# Patient Record
Sex: Male | Born: 1970 | Race: Black or African American | Hispanic: No | Marital: Married
Health system: Midwestern US, Community
[De-identification: ages and names within clinical notes are randomized; demographics above are authoritative.]

## PROBLEM LIST (undated history)

## (undated) DIAGNOSIS — M545 Low back pain, unspecified: Secondary | ICD-10-CM

## (undated) DIAGNOSIS — I10 Essential (primary) hypertension: Secondary | ICD-10-CM

## (undated) DIAGNOSIS — E785 Hyperlipidemia, unspecified: Secondary | ICD-10-CM

## (undated) DIAGNOSIS — E215 Disorder of parathyroid gland, unspecified: Secondary | ICD-10-CM

## (undated) DIAGNOSIS — E119 Type 2 diabetes mellitus without complications: Secondary | ICD-10-CM

## (undated) DIAGNOSIS — I499 Cardiac arrhythmia, unspecified: Secondary | ICD-10-CM

## (undated) DIAGNOSIS — G4730 Sleep apnea, unspecified: Secondary | ICD-10-CM

## (undated) DIAGNOSIS — G8929 Other chronic pain: Secondary | ICD-10-CM

## (undated) DIAGNOSIS — E213 Hyperparathyroidism, unspecified: Secondary | ICD-10-CM

## (undated) DIAGNOSIS — E210 Primary hyperparathyroidism: Secondary | ICD-10-CM

## (undated) HISTORY — DX: Cardiac arrhythmia, unspecified: I49.9

## (undated) HISTORY — DX: Low back pain, unspecified: M54.50

## (undated) HISTORY — DX: Other chronic pain: G89.29

## (undated) HISTORY — DX: Hyperlipidemia, unspecified: E78.5

## (undated) HISTORY — PX: PARATHYROIDECTOMY: SHX19

## (undated) HISTORY — PX: THYROID SURGERY: SHX805

## (undated) HISTORY — DX: Low back pain: M54.5

## (undated) HISTORY — DX: Disorder of parathyroid gland, unspecified: E21.5

## (undated) HISTORY — DX: Hyperparathyroidism, unspecified: E21.3

## (undated) HISTORY — DX: Sleep apnea, unspecified: G47.30

---

## 2016-12-09 ENCOUNTER — Encounter

## 2016-12-22 ENCOUNTER — Encounter

## 2016-12-30 ENCOUNTER — Inpatient Hospital Stay: Admit: 2016-12-30 | Payer: PRIVATE HEALTH INSURANCE | Attending: "Endocrinology | Primary: Family Medicine

## 2016-12-30 DIAGNOSIS — M899 Disorder of bone, unspecified: Secondary | ICD-10-CM

## 2017-01-09 ENCOUNTER — Inpatient Hospital Stay: Admit: 2017-01-09 | Payer: PRIVATE HEALTH INSURANCE | Attending: "Endocrinology | Primary: Family Medicine

## 2017-01-09 DIAGNOSIS — E210 Primary hyperparathyroidism: Secondary | ICD-10-CM

## 2017-01-09 MED ORDER — TECHNETIUM TC 99M SESTAMIBI - CARDIOLITE
Freq: Once | Status: AC
Start: 2017-01-09 — End: 2017-01-09
  Administered 2017-01-09: 16:00:00 via INTRAVENOUS

## 2017-02-15 ENCOUNTER — Encounter

## 2017-03-10 ENCOUNTER — Inpatient Hospital Stay: Admit: 2017-03-10 | Payer: PRIVATE HEALTH INSURANCE | Primary: Family Medicine

## 2017-03-10 DIAGNOSIS — Z01818 Encounter for other preprocedural examination: Secondary | ICD-10-CM

## 2017-03-10 LAB — METABOLIC PANEL, BASIC
Anion gap: 8 mmol/L (ref 5–15)
BUN/Creatinine ratio: 15 (ref 12–20)
BUN: 15 MG/DL (ref 6–20)
CO2: 28 mmol/L (ref 21–32)
Calcium: 10.6 MG/DL — ABNORMAL HIGH (ref 8.5–10.1)
Chloride: 106 mmol/L (ref 97–108)
Creatinine: 1 MG/DL (ref 0.70–1.30)
GFR est AA: >60 mL/min/{1.73_m2} (ref >60)
GFR est non-AA: >60 mL/min/{1.73_m2} (ref >60)
Glucose: 131 mg/dL — ABNORMAL HIGH (ref 65–100)
Potassium: 3.7 mmol/L (ref 3.5–5.1)
Sodium: 142 mmol/L (ref 136–145)

## 2017-03-10 NOTE — Other (Addendum)
PAT instructions reviewed with patient and family; and given the opportunity to ask questions.Patient given surgical site information FAQs handout and reviewed. Patient given CHG wipes and instruction sheet, instructions for use reviewed with patient. 1520 Notified Donna Bernardina Snyder, RN - ASU re: patient weight 413 lbs.

## 2017-03-11 LAB — EKG, 12 LEAD, INITIAL
Atrial Rate: 72 {beats}/min
Calculated P Axis: 0 degrees
Calculated R Axis: 46 degrees
Calculated T Axis: 38 degrees
Diagnosis: NORMAL
P-R Interval: 156 ms
Q-T Interval: 376 ms
QRS Duration: 98 ms
QTC Calculation (Bezet): 411 ms
Ventricular Rate: 72 {beats}/min

## 2017-03-11 LAB — EKG 12-LEAD
Atrial Rate: 72 {beats}/min
Diagnosis: NORMAL
P Axis: 0 degrees
P-R Interval: 156 ms
Q-T Interval: 376 ms
QRS Duration: 98 ms
QTc Calculation (Bazett): 411 ms
R Axis: 46 degrees
T Axis: 38 degrees
Ventricular Rate: 72 {beats}/min

## 2017-03-20 ENCOUNTER — Ambulatory Visit: Admit: 2017-03-20 | Payer: PRIVATE HEALTH INSURANCE | Primary: Family Medicine

## 2017-03-20 ENCOUNTER — Inpatient Hospital Stay: Payer: PRIVATE HEALTH INSURANCE

## 2017-03-20 LAB — PTH, 10 MIN. POST-EXCISION, INTRA-OP
Intra-op PTH: 35.8 pg/mL (ref 18.4–88.0)
Time drawn: 1150

## 2017-03-20 LAB — PTH, PRE-PREP, INTRA-OP
Intra-op PTH: 175.3 pg/mL — ABNORMAL HIGH (ref 18.4–88.0)
Time drawn: 1050

## 2017-03-20 LAB — PTH, PRE-EXCISION, INTRA-OP
Intra-op PTH: 221.2 pg/mL — ABNORMAL HIGH (ref 18.4–88.0)
Time drawn: 1203

## 2017-03-20 MED ORDER — LIDOCAINE (PF) 20 MG/ML (2 %) IJ SOLN
20 mg/mL (2 %) | INTRAMUSCULAR | Status: DC | PRN
Start: 2017-03-20 — End: 2017-03-20
  Administered 2017-03-20: 15:00:00 via INTRAVENOUS

## 2017-03-20 MED ORDER — MIDAZOLAM 1 MG/ML IJ SOLN
1 mg/mL | INTRAMUSCULAR | Status: AC
Start: 2017-03-20 — End: ?

## 2017-03-20 MED ORDER — LACTATED RINGERS IV
INTRAVENOUS | Status: DC | PRN
Start: 2017-03-20 — End: 2017-03-20
  Administered 2017-03-20: 15:00:00 via INTRAVENOUS

## 2017-03-20 MED ORDER — LACTATED RINGERS IV
INTRAVENOUS | Status: DC
Start: 2017-03-20 — End: 2017-03-20
  Administered 2017-03-20: 17:00:00 via INTRAVENOUS

## 2017-03-20 MED ORDER — DEXAMETHASONE SODIUM PHOSPHATE 4 MG/ML IJ SOLN
4 mg/mL | INTRAMUSCULAR | Status: DC | PRN
Start: 2017-03-20 — End: 2017-03-20
  Administered 2017-03-20: 15:00:00 via INTRAVENOUS

## 2017-03-20 MED ORDER — SODIUM CHLORIDE 0.9 % IJ SYRG
INTRAMUSCULAR | Status: DC | PRN
Start: 2017-03-20 — End: 2017-03-21

## 2017-03-20 MED ORDER — DEXTROSE 5%-LACTATED RINGERS IV
INTRAVENOUS | Status: DC
Start: 2017-03-20 — End: 2017-03-20

## 2017-03-20 MED ORDER — MORPHINE 2 MG/ML INJECTION
2 mg/mL | INTRAMUSCULAR | Status: DC | PRN
Start: 2017-03-20 — End: 2017-03-21

## 2017-03-20 MED ORDER — DEXTROSE 5%-LACTATED RINGERS IV
INTRAVENOUS | Status: DC
Start: 2017-03-20 — End: 2017-03-21
  Administered 2017-03-20 – 2017-03-21 (×3): via INTRAVENOUS

## 2017-03-20 MED ORDER — PROPOFOL 10 MG/ML IV EMUL
10 mg/mL | INTRAVENOUS | Status: DC | PRN
Start: 2017-03-20 — End: 2017-03-20
  Administered 2017-03-20 (×2): via INTRAVENOUS

## 2017-03-20 MED ORDER — ONDANSETRON (PF) 4 MG/2 ML INJECTION
4 mg/2 mL | INTRAMUSCULAR | Status: DC | PRN
Start: 2017-03-20 — End: 2017-03-20

## 2017-03-20 MED ORDER — CHLORTHALIDONE 25 MG TAB
25 mg | Freq: Every day | ORAL | Status: DC
Start: 2017-03-20 — End: 2017-03-21
  Administered 2017-03-21: 13:00:00 via ORAL

## 2017-03-20 MED ORDER — SODIUM CHLORIDE 0.9 % IJ SYRG
Freq: Three times a day (TID) | INTRAMUSCULAR | Status: DC
Start: 2017-03-20 — End: 2017-03-21
  Administered 2017-03-20 – 2017-03-21 (×3): via INTRAVENOUS

## 2017-03-20 MED ORDER — SODIUM CHLORIDE 0.9 % IJ SYRG
Freq: Three times a day (TID) | INTRAMUSCULAR | Status: DC
Start: 2017-03-20 — End: 2017-03-21
  Administered 2017-03-20 – 2017-03-21 (×3): via INTRAVENOUS

## 2017-03-20 MED ORDER — BACITRACIN 500 UNIT/G OINTMENT
500 unit/gram | CUTANEOUS | Status: AC
Start: 2017-03-20 — End: ?

## 2017-03-20 MED ORDER — ROCURONIUM 10 MG/ML IV
10 mg/mL | INTRAVENOUS | Status: DC | PRN
Start: 2017-03-20 — End: 2017-03-20
  Administered 2017-03-20: 15:00:00 via INTRAVENOUS

## 2017-03-20 MED ORDER — MIDAZOLAM 1 MG/ML IJ SOLN
1 mg/mL | INTRAMUSCULAR | Status: DC | PRN
Start: 2017-03-20 — End: 2017-03-20

## 2017-03-20 MED ORDER — FENTANYL CITRATE (PF) 50 MCG/ML IJ SOLN
50 mcg/mL | INTRAMUSCULAR | Status: AC
Start: 2017-03-20 — End: ?

## 2017-03-20 MED ORDER — FENTANYL CITRATE (PF) 50 MCG/ML IJ SOLN
50 mcg/mL | INTRAMUSCULAR | Status: DC | PRN
Start: 2017-03-20 — End: 2017-03-20
  Administered 2017-03-20 (×2): via INTRAVENOUS

## 2017-03-20 MED ORDER — SODIUM CHLORIDE 0.9 % IJ SYRG
INTRAMUSCULAR | Status: DC | PRN
Start: 2017-03-20 — End: 2017-03-20

## 2017-03-20 MED ORDER — OXYCODONE 5 MG TAB
5 mg | ORAL | Status: DC | PRN
Start: 2017-03-20 — End: 2017-03-20

## 2017-03-20 MED ORDER — DIPHENHYDRAMINE HCL 50 MG/ML IJ SOLN
50 mg/mL | INTRAMUSCULAR | Status: DC | PRN
Start: 2017-03-20 — End: 2017-03-20

## 2017-03-20 MED ORDER — CHOLECALCIFEROL (VITAMIN D3) 1,000 UNIT (25 MCG) TAB
Freq: Every day | ORAL | Status: DC
Start: 2017-03-20 — End: 2017-03-21
  Administered 2017-03-21: 13:00:00 via ORAL

## 2017-03-20 MED ORDER — SODIUM CHLORIDE 0.9 % IJ SYRG
Freq: Three times a day (TID) | INTRAMUSCULAR | Status: DC
Start: 2017-03-20 — End: 2017-03-20

## 2017-03-20 MED ORDER — MORPHINE 2 MG/ML INJECTION
2 mg/mL | INTRAMUSCULAR | Status: DC | PRN
Start: 2017-03-20 — End: 2017-03-20

## 2017-03-20 MED ORDER — LIDOCAINE-EPINEPHRINE 1 %-1:100,000 IJ SOLN
1 %-:00,000 | INTRAMUSCULAR | Status: AC
Start: 2017-03-20 — End: ?

## 2017-03-20 MED ORDER — LIDOCAINE (PF) 10 MG/ML (1 %) IJ SOLN
10 mg/mL (1 %) | INTRAMUSCULAR | Status: DC | PRN
Start: 2017-03-20 — End: 2017-03-20

## 2017-03-20 MED ORDER — ATORVASTATIN 20 MG TAB
20 mg | Freq: Every evening | ORAL | Status: DC
Start: 2017-03-20 — End: 2017-03-21
  Administered 2017-03-21: 01:00:00 via ORAL

## 2017-03-20 MED ORDER — MIDAZOLAM 1 MG/ML IJ SOLN
1 mg/mL | INTRAMUSCULAR | Status: DC | PRN
Start: 2017-03-20 — End: 2017-03-20
  Administered 2017-03-20: 15:00:00 via INTRAVENOUS

## 2017-03-20 MED ORDER — OXYCODONE-ACETAMINOPHEN 5 MG-325 MG TAB
5-325 mg | ORAL | Status: DC | PRN
Start: 2017-03-20 — End: 2017-03-20

## 2017-03-20 MED ORDER — ACETAMINOPHEN 500 MG TAB
500 mg | Freq: Four times a day (QID) | ORAL | Status: DC | PRN
Start: 2017-03-20 — End: 2017-03-21
  Administered 2017-03-20 – 2017-03-21 (×3): via ORAL

## 2017-03-20 MED ORDER — ONDANSETRON (PF) 4 MG/2 ML INJECTION
4 mg/2 mL | INTRAMUSCULAR | Status: DC | PRN
Start: 2017-03-20 — End: 2017-03-20
  Administered 2017-03-20: 15:00:00 via INTRAVENOUS

## 2017-03-20 MED ORDER — LACTATED RINGERS IV
INTRAVENOUS | Status: DC
Start: 2017-03-20 — End: 2017-03-21
  Administered 2017-03-20 (×3): via INTRAVENOUS

## 2017-03-20 MED ORDER — HYDROCODONE-ACETAMINOPHEN 5 MG-325 MG TAB
5-325 mg | ORAL_TABLET | Freq: Four times a day (QID) | ORAL | 0 refills | Status: AC | PRN
Start: 2017-03-20 — End: ?

## 2017-03-20 MED ORDER — LIDOCAINE-EPINEPHRINE 1 %-1:100,000 IJ SOLN
1 %-:00,000 | INTRAMUSCULAR | Status: DC | PRN
Start: 2017-03-20 — End: 2017-03-20
  Administered 2017-03-20: 15:00:00 via SUBCUTANEOUS

## 2017-03-20 MED ORDER — FENTANYL CITRATE (PF) 50 MCG/ML IJ SOLN
50 mcg/mL | INTRAMUSCULAR | Status: DC | PRN
Start: 2017-03-20 — End: 2017-03-20
  Administered 2017-03-20 (×4): via INTRAVENOUS

## 2017-03-20 MED ORDER — LACTATED RINGERS IV
INTRAVENOUS | Status: DC
Start: 2017-03-20 — End: 2017-03-20

## 2017-03-20 MED ORDER — CEFAZOLIN 3G IN 100 ML 0.9% NS PREMIX
3 gram/100ml | Freq: Once | INTRAVENOUS | Status: AC
Start: 2017-03-20 — End: 2017-03-20
  Administered 2017-03-20: 15:00:00 via INTRAVENOUS

## 2017-03-20 MED ORDER — FENTANYL CITRATE (PF) 50 MCG/ML IJ SOLN
50 mcg/mL | INTRAMUSCULAR | Status: DC | PRN
Start: 2017-03-20 — End: 2017-03-20

## 2017-03-20 MED ORDER — TECHNETIUM TC 99M SESTAMIBI - CARDIOLITE
Freq: Once | Status: AC
Start: 2017-03-20 — End: 2017-03-20
  Administered 2017-03-20: 13:00:00 via INTRAVENOUS

## 2017-03-20 MED ORDER — OXYCODONE-ACETAMINOPHEN 5 MG-325 MG TAB
5-325 mg | ORAL | Status: DC | PRN
Start: 2017-03-20 — End: 2017-03-21

## 2017-03-20 MED ORDER — MORPHINE 10 MG/ML INJ SOLUTION
10 mg/ml | INTRAMUSCULAR | Status: DC | PRN
Start: 2017-03-20 — End: 2017-03-20

## 2017-03-20 MED ORDER — SUCCINYLCHOLINE CHLORIDE 20 MG/ML INJECTION
20 mg/mL | INTRAMUSCULAR | Status: DC | PRN
Start: 2017-03-20 — End: 2017-03-20
  Administered 2017-03-20: 15:00:00 via INTRAVENOUS

## 2017-03-20 MED FILL — LACTATED RINGERS IV: INTRAVENOUS | Qty: 1000

## 2017-03-20 MED FILL — CEFAZOLIN 3G IN 100 ML 0.9% NS PREMIX: 3 gram/100ml | INTRAVENOUS | Qty: 100

## 2017-03-20 MED FILL — MIDAZOLAM 1 MG/ML IJ SOLN: 1 mg/mL | INTRAMUSCULAR | Qty: 2

## 2017-03-20 MED FILL — DEXTROSE 5%-LACTATED RINGERS IV: INTRAVENOUS | Qty: 1000

## 2017-03-20 MED FILL — LIDOCAINE-EPINEPHRINE 1 %-1:100,000 IJ SOLN: 1 %-:00,000 | INTRAMUSCULAR | Qty: 20

## 2017-03-20 MED FILL — ACETAMINOPHEN 500 MG TAB: 500 mg | ORAL | Qty: 2

## 2017-03-20 MED FILL — FENTANYL CITRATE (PF) 50 MCG/ML IJ SOLN: 50 mcg/mL | INTRAMUSCULAR | Qty: 2

## 2017-03-20 MED FILL — XYLOCAINE-MPF 10 MG/ML (1 %) INJECTION SOLUTION: 10 mg/mL (1 %) | INTRAMUSCULAR | Qty: 5

## 2017-03-20 MED FILL — BACITRACIN 500 UNIT/G OINTMENT: 500 unit/gram | CUTANEOUS | Qty: 14

## 2017-03-20 MED FILL — FENTANYL CITRATE (PF) 50 MCG/ML IJ SOLN: 50 mcg/mL | INTRAMUSCULAR | Qty: 5

## 2017-03-20 NOTE — H&P (Signed)
History and Physical    Subjective:     Clifford Summers is a 46 y.o. African American male who presents with primary hyperparathyroidism, without localization by imaging.         Past Medical History:   Diagnosis Date   ??? Arrhythmia     HX IRREG HR RELATED ELEVATED CALCIUM   ??? Chronic pain     LOWER BACK   ??? Hypertension    ??? Parathyroid disease (HCC)    ??? Sleep apnea     CPAP      Past Surgical History:   Procedure Laterality Date   ??? HX GASTRIC BYPASS  2009    LAP BAND     Family History   Problem Relation Age of Onset   ??? Cancer Mother      UTERINE   ??? Anesth Problems Neg Hx       Social History   Substance Use Topics   ??? Smoking status: Passive Smoke Exposure - Never Smoker   ??? Smokeless tobacco: Never Used   ??? Alcohol use No       Prior to Admission medications    Medication Sig Start Date End Date Taking? Authorizing Provider   HYDROcodone-acetaminophen (NORCO) 5-325 mg per tablet Take 2 Tabs by mouth every six (6) hours as needed for Pain for up to 30 doses. Max Daily Amount: 8 Tabs. Indications: Pain 03/20/17  Yes Roxana HiresAlan J Cid [employer], MD   atorvastatin (LIPITOR) 20 mg tablet Take 20 mg by mouth nightly.   Yes Historical Provider   cholecalciferol (VITAMIN D3) 1,000 unit cap Take 1,000 Units by mouth daily.   Yes Historical Provider   ibuprofen (MOTRIN) 200 mg tablet Take 600 mg by mouth every six (6) hours as needed for Pain.   Yes Historical Provider   multivitamin (ONE A DAY) tablet Take 1 Tab by mouth daily.   Yes Historical Provider   krill oil 500 mg cap Take 1 Cap by mouth two (2) days a week.   Yes Historical Provider   chlorthalidone (HYGROTEN) 25 mg tablet Take 25 mg by mouth daily.    Historical Provider     No Known Allergies     Review of Systems:  A comprehensive review of systems was negative except for that written in the History of Present Illness.     Objective:     Intake and Output:            Physical Exam:   Visit Vitals   ??? BP 150/88 (BP 1 Location: Right arm, BP Patient Position: Sitting)    ??? Pulse 83   ??? Temp 97.9 ??F (36.6 ??C)   ??? Resp 20   ??? Ht 6\' 2"  (1.88 m)   ??? Wt (!) 187.3 kg (413 lb)   ??? SpO2 99%   ??? BMI 53.03 kg/m2     General:  Alert, cooperative, no distress, appears stated age.  Obese   Head:  Normocephalic, without obvious abnormality, atraumatic.   Eyes:  Conjunctivae/corneas clear. PERRL, EOMs intact. Fundi benign   Ears:  Normal TMs and external ear canals both ears.   Nose: Nares normal. Septum midline. Mucosa normal. No drainage or sinus tenderness.   Throat: Lips, mucosa, and tongue normal. Teeth and gums normal.   Neck: Supple, symmetrical, trachea midline, no adenopathy, thyroid: no enlargement/tenderness/nodules, no carotid bruit and no JVD.   Back:   Symmetric, no curvature. ROM normal. No CVA tenderness.   Lungs:   Clear  to auscultation bilaterally.   Chest wall:  No tenderness or deformity.   Heart:  Regular rate and rhythm, S1, S2 normal, no murmur, click, rub or gallop.   Abdomen:   Soft, non-tender. Bowel sounds normal. No masses,  No organomegaly.   Genitalia:  Normal male without lesion, discharge or tenderness.   Rectal:  Normal tone, normal prostate, no masses or tenderness  Guaiac negative stool.   Extremities: Extremities normal, atraumatic, no cyanosis or edema.   Pulses: 2+ and symmetric all extremities.   Skin: Skin color, texture, turgor normal. No rashes or lesions   Lymph nodes: Cervical, supraclavicular, and axillary nodes normal.   Neurologic: CNII-XII intact. Normal strength, sensation and reflexes throughout.         Data Review:   No results found for this or any previous visit (from the past 24 hour(s)).      Assessment:     Active Problems:    Hyperparathyroidism, primary (HCC) (03/20/2017)        Plan:     Parathyroid exploration with intraoperative Sestimibi and PTH monitoring.     Signed By: Roxana Hires, MD     March 20, 2017

## 2017-03-20 NOTE — Progress Notes (Signed)
Dr. Laurell JosephsBurke [facility] . Already aware of them. Asked if need to do any more labs this pm. No orders needed for now per MD.

## 2017-03-20 NOTE — Progress Notes (Signed)
Bedside and Verbal shift change report given to Linda, RN (oncoming nurse) by Katherine Dabney, RN (offgoing nurse). Report included the following information SBAR, Kardex, Intake/Output, MAR, Accordion, Recent Results and Med Rec Status.

## 2017-03-20 NOTE — Anesthesia Pre-Procedure Evaluation (Addendum)
Anesthetic History   No history of anesthetic complications            Review of Systems / Medical History  Patient summary reviewed, nursing notes reviewed and pertinent labs reviewed    Pulmonary        Sleep apnea: CPAP           Neuro/Psych   Within defined limits           Cardiovascular    Hypertension        Dysrhythmias       Exercise tolerance: >4 METS     GI/Hepatic/Renal  Within defined limits              Endo/Other        Morbid obesity     Other Findings            Physical Exam    Airway  Mallampati: III  TM Distance: > 6 cm  Neck ROM: normal range of motion   Mouth opening: Normal     Cardiovascular  Regular rate and rhythm,  S1 and S2 normal,  no murmur, click, rub, or gallop             Dental  No notable dental hx       Pulmonary  Breath sounds clear to auscultation               Abdominal  GI exam deferred       Other Findings            Anesthetic Plan    ASA: 3  Anesthesia type: general          Induction: Intravenous  Anesthetic plan and risks discussed with: Patient

## 2017-03-20 NOTE — Anesthesia Post-Procedure Evaluation (Signed)
Post-Anesthesia Evaluation and Assessment    Patient: Clifford LopeKeith E Capozzoli MRN: [id number]  SSN: [id number]    Date of Birth: 06/18/1971  Age: 46 y.o.  Sex: male       Cardiovascular Function/Vital Signs  Visit Vitals   ??? BP (!) 150/91   ??? Pulse (!) 102   ??? Temp 36.4 ??C (97.6 ??F)   ??? Resp 12   ??? Ht 6\' 2"  (1.88 m)   ??? Wt (!) 187.3 kg (413 lb)   ??? SpO2 98%   ??? BMI 53.03 kg/m2       Patient is status post general anesthesia for Procedure(s):  PARATHYROIDECTOMY WITH RADIONUCLIDE GUIDANCE SUING TC-42M SESTAMIBI.    Nausea/Vomiting: None    Postoperative hydration reviewed and adequate.    Pain:  Pain Scale 1: Numeric (0 - 10) (03/20/17 1001)  Pain Intensity 1: 0 (03/20/17 1001)   Managed    Neurological Status:   Neuro (WDL): Within Defined Limits (03/20/17 1013)   At baseline    Mental Status and Level of Consciousness: Arousable    Pulmonary Status:   O2 Device: Nasal cannula (03/20/17 1214)   Adequate oxygenation and airway patent    Complications related to anesthesia: None    Post-anesthesia assessment completed. No concerns    Signed By: Chyrel MassonMichael D Sameena Artus, MD     March 20, 2017

## 2017-03-20 NOTE — Progress Notes (Signed)
Primary Nurse Dwana MelenaKatherine Dabney and Dory PeruGibson Burlee, RN performed a dual skin assessment on this patient Impairment noted- see wound doc flow sheet - TLS Drain   Braden score is 23

## 2017-03-20 NOTE — Progress Notes (Signed)
TRANSFER - OUT REPORT:    Verbal report given to Katie(name) on Clifford Summers  being transferred to 5 east(unit) for routine post - op       Report consisted of patient???s Situation, Background, Assessment and   Recommendations(SBAR).     Information from the following report(s) SBAR, OR Summary and MAR was reviewed with the receiving nurse.    Lines:   Peripheral IV 03/20/17 Left Wrist (Active)   Site Assessment Clean, dry, & intact 03/20/2017 10:27 AM   Phlebitis Assessment 0 03/20/2017 10:27 AM   Infiltration Assessment 0 03/20/2017 10:27 AM   Dressing Status Clean, dry, & intact 03/20/2017 10:27 AM   Dressing Type Transparent 03/20/2017 10:27 AM   Hub Color/Line Status Pink 03/20/2017 10:27 AM        Opportunity for questions and clarification was provided.      Patient transported with:   Registered Nurse

## 2017-03-20 NOTE — Op Note (Signed)
[facility]  ACUTE CARE OP NOTE    Name:Clifford Summers, Clifford Summers.  MR#: [id number]  DOB: 07/25/1971  ACCOUNT #: [id number]   DATE OF SERVICE: 03/20/2017    PREOPERATIVE DIAGNOSIS:  Primary hyperparathyroidism.    POSTOPERATIVE DIAGNOSIS:  Primary hyperparathyroidism.    PROCEDURE:  Bilateral parathyroid exploration.  Intraoperative sestamibi imaging.      SURGEON:  Cherri Yera, MD.    ASSISTANT:  none     ANESTHESIA:  General endotracheal anesthesia.    COMPLICATIONS:  No complications.    BLOOD LOSS:  20 mL.    SPECIMEN:  Right superior parathyroid mass.    IMPLANTS:  No implants.    INDICATIONS AND FINDINGS:  The patient was identified as having primary hyperparathyroidism with negative preoperative imaging.  Because of this challenging patient's anatomy, the patient is markedly obese, intraoperative sestamibi imaging was planned.  The procedure was much more challenging because of the patient's  challenging surgery and patient.      DETAILS OF PROCEDURE:  In the operating room, the patient was induced under general endotracheal tube anesthesia.  Following that, she was prepped and draped in a sterile fashion.  1% lidocaine with 1:100,000 epinephrine was used for local infiltration in the lower cervical rhytides.  The 5 cm incision was made in this ___ elevating superior and inferior subplatysmal skin flaps, retracted with silk sutures.  Strap muscles were divided in the midline and retracted.  The thyroid gland was identified and retracted with Allis clamp.  An extracapsular dissection was performed with bipolar cautery, McCabe dissector, and Kittner dissector, skeletonizing the superior and inferior pole starting on the right and then the left.  With the paratracheal anatomy exposed, the Neoprobe Geiger counter was used to identify an increased measurement in the right upper posterior zone.  The superior and inferior poles were explored bilaterally, identifying normal parathyroid glands at the  inferior pole.  Dissection continued more posterior on the right, identifying an abnormal parathyroid appearing mass in the precervical spine area, retroesophageal, medial to the carotid artery, consistent with a right superior parathyroid gland.  This was skeletonized using bipolar cautery to divide the vascular pedicle, handing this off for pathologic analysis, returning as a hypercellular gland.    Intraoperative parathyroid hormone measurement started at 170.  Ten minutes after dissecting the gland, the following measurement is approximately 30 consistent with removal of the culprit gland.    The wound was irrigated.  A Valsalva maneuver was used to check hemostasis.  A #10 TLS drain was placed on the left, secured with a nylon suture.  A 3-0 Vicryl was used to close the strap muscles in the midline, using the same to close the platysmal flap, after which a 5-0 Monocryl was used in an intracuticular fashion to close the skin, reinforced with Mastisol and Steri-Strip dressing.  The patient was then handed to anesthesia for awakening and transferred to the recovery room.      Deztinee Lohmeyer J. Bradley Bostelman, MD       AJB / DN  D: 03/20/2017 16:19     T: 03/20/2017 20:58  JOB #: [id number]

## 2017-03-20 NOTE — Progress Notes (Signed)
[facility] Dr. Asencion PartridgeJoseph Castro  On Monday March 27, 2017  @ 10:15 a.m. Added to AVS. Delma OfficerKristen Walker, CM Specialist

## 2017-03-20 NOTE — Op Note (Signed)
[facility]  ACUTE CARE OP NOTE    Name:Clifford Summers, Clifford Summers FormKEITH E.  MR#: [id number]  DOB: 1971/08/07  ACCOUNT #: [id number]   DATE OF SERVICE: 03/20/2017    PREOPERATIVE DIAGNOSIS:  Primary hyperparathyroidism.    POSTOPERATIVE DIAGNOSIS:  Primary hyperparathyroidism.    PROCEDURE:  Bilateral parathyroid exploration.  Intraoperative sestamibi imaging.      SURGEON:  Selinda EonAlan Sheleen Conchas, MD.    ASSISTANT:  none     ANESTHESIA:  General endotracheal anesthesia.    COMPLICATIONS:  No complications.    BLOOD LOSS:  20 mL.    SPECIMEN:  Right superior parathyroid mass.    IMPLANTS:  No implants.    INDICATIONS AND FINDINGS:  The patient was identified as having primary hyperparathyroidism with negative preoperative imaging.  Because of this challenging patient's anatomy, the patient is markedly obese, intraoperative sestamibi imaging was planned.  The procedure was much more challenging because of the patient's  challenging surgery and patient.      DETAILS OF PROCEDURE:  In the operating room, the patient was induced under general endotracheal tube anesthesia.  Following that, she was prepped and draped in a sterile fashion.  1% lidocaine with 1:100,000 epinephrine was used for local infiltration in the lower cervical rhytides.  The 5 cm incision was made in this ___ elevating superior and inferior subplatysmal skin flaps, retracted with silk sutures.  Strap muscles were divided in the midline and retracted.  The thyroid gland was identified and retracted with Allis clamp.  An extracapsular dissection was performed with bipolar cautery, McCabe dissector, and Kittner dissector, skeletonizing the superior and inferior pole starting on the right and then the left.  With the paratracheal anatomy exposed, the Neoprobe Geiger counter was used to identify an increased measurement in the right upper posterior zone.  The superior and inferior poles were explored bilaterally, identifying normal parathyroid glands at the  inferior pole.  Dissection continued more posterior on the right, identifying an abnormal parathyroid appearing mass in the precervical spine area, retroesophageal, medial to the carotid artery, consistent with a right superior parathyroid gland.  This was skeletonized using bipolar cautery to divide the vascular pedicle, handing this off for pathologic analysis, returning as a hypercellular gland.    Intraoperative parathyroid hormone measurement started at 170.  Ten minutes after dissecting the gland, the following measurement is approximately 30 consistent with removal of the culprit gland.    The wound was irrigated.  A Valsalva maneuver was used to check hemostasis.  A #10 TLS drain was placed on the left, secured with a nylon suture.  A 3-0 Vicryl was used to close the strap muscles in the midline, using the same to close the platysmal flap, after which a 5-0 Monocryl was used in an intracuticular fashion to close the skin, reinforced with Mastisol and Steri-Strip dressing.  The patient was then handed to anesthesia for awakening and transferred to the recovery room.      Roxana HiresALAN J. Cynithia Hakimi, MD       AJB / DN  D: 03/20/2017 16:19     T: 03/20/2017 20:58  JOB #: [id number]

## 2017-03-21 MED FILL — ONDANSETRON (PF) 4 MG/2 ML INJECTION: 4 mg/2 mL | INTRAMUSCULAR | Qty: 2

## 2017-03-21 MED FILL — NORMAL SALINE FLUSH 0.9 % INJECTION SYRINGE: INTRAMUSCULAR | Qty: 10

## 2017-03-21 MED FILL — CHOLECALCIFEROL (VITAMIN D3) 1,000 UNIT (25 MCG) TAB: ORAL | Qty: 2

## 2017-03-21 MED FILL — XYLOCAINE-MPF 20 MG/ML (2 %) INJECTION SOLUTION: 20 mg/mL (2 %) | INTRAMUSCULAR | Qty: 5

## 2017-03-21 MED FILL — ACETAMINOPHEN 500 MG TAB: 500 mg | ORAL | Qty: 2

## 2017-03-21 MED FILL — DEXAMETHASONE SODIUM PHOSPHATE 4 MG/ML IJ SOLN: 4 mg/mL | INTRAMUSCULAR | Qty: 2

## 2017-03-21 MED FILL — PROPOFOL 10 MG/ML IV EMUL: 10 mg/mL | INTRAVENOUS | Qty: 40

## 2017-03-21 MED FILL — LIPITOR 20 MG TABLET: 20 mg | ORAL | Qty: 1

## 2017-03-21 MED FILL — QUELICIN 20 MG/ML INJECTION SOLUTION: 20 mg/mL | INTRAMUSCULAR | Qty: 10

## 2017-03-21 MED FILL — CHLORTHALIDONE 25 MG TAB: 25 mg | ORAL | Qty: 1

## 2017-03-21 MED FILL — ROCURONIUM 10 MG/ML IV: 10 mg/mL | INTRAVENOUS | Qty: 1

## 2017-03-21 NOTE — Progress Notes (Signed)
RN spoke with Dr. Laurell JosephsBurke to confirm plans for discharge.  Received orders to take out TLS drain and discharge patient.  Will proceed as ordered.

## 2017-03-21 NOTE — Progress Notes (Signed)
Written and verbal discharge instructions reviewed with patient.  Patient confirmed understanding with adequate teach back.  IV x1 and TLS drain removed per order.  Belongings with patient at time of discharge.

## 2017-03-21 NOTE — Progress Notes (Signed)
Spiritual Care Partner Volunteer visited patient in Rm 503 on 03/21/2017.  Documented by:  Clancy GourdLarry Johnson, Chaplain, MDiv, MS, BCC  [phone] [phone]

## 2017-03-21 NOTE — Progress Notes (Signed)
Bedside shift change report given to Anna, RN (oncoming nurse) by Dana, RN (offgoing nurse). Report included the following information SBAR, Kardex, Procedure Summary, Intake/Output and MAR.

## 2018-10-23 ENCOUNTER — Other Ambulatory Visit: Payer: Self-pay | Admitting: Family Medicine

## 2018-10-23 ENCOUNTER — Other Ambulatory Visit: Payer: Self-pay | Admitting: Ophthalmology

## 2018-10-23 ENCOUNTER — Ambulatory Visit
Admission: RE | Admit: 2018-10-23 | Discharge: 2018-10-23 | Disposition: A | Discharge status: Home / Self Care | Payer: Managed Care, Other (non HMO) | Source: Ambulatory Visit | Attending: Ophthalmology | Admitting: Ophthalmology

## 2018-10-23 DIAGNOSIS — H209 Unspecified iridocyclitis: Secondary | ICD-10-CM

## 2018-10-28 ENCOUNTER — Emergency Department (HOSPITAL_COMMUNITY)
Admission: EM | Admit: 2018-10-28 | Discharge: 2018-10-29 | Disposition: A | Discharge status: Home / Self Care | Payer: Managed Care, Other (non HMO) | Attending: Emergency Medicine | Admitting: Emergency Medicine

## 2018-10-28 ENCOUNTER — Emergency Department (HOSPITAL_COMMUNITY): Payer: Managed Care, Other (non HMO)

## 2018-10-28 ENCOUNTER — Other Ambulatory Visit: Payer: Self-pay

## 2018-10-28 ENCOUNTER — Encounter (HOSPITAL_COMMUNITY): Payer: Self-pay | Admitting: Emergency Medicine

## 2018-10-28 DIAGNOSIS — E876 Hypokalemia: Secondary | ICD-10-CM | POA: Insufficient documentation

## 2018-10-28 DIAGNOSIS — R079 Chest pain, unspecified: Secondary | ICD-10-CM

## 2018-10-28 DIAGNOSIS — R142 Eructation: Secondary | ICD-10-CM | POA: Diagnosis not present

## 2018-10-28 DIAGNOSIS — E119 Type 2 diabetes mellitus without complications: Secondary | ICD-10-CM | POA: Insufficient documentation

## 2018-10-28 DIAGNOSIS — R0789 Other chest pain: Secondary | ICD-10-CM | POA: Insufficient documentation

## 2018-10-28 DIAGNOSIS — R2243 Localized swelling, mass and lump, lower limb, bilateral: Secondary | ICD-10-CM | POA: Diagnosis not present

## 2018-10-28 DIAGNOSIS — I10 Essential (primary) hypertension: Secondary | ICD-10-CM | POA: Insufficient documentation

## 2018-10-28 HISTORY — DX: Type 2 diabetes mellitus without complications: E11.9

## 2018-10-28 HISTORY — DX: Essential (primary) hypertension: I10

## 2018-10-28 LAB — CBC
HEMATOCRIT: 45.5 % (ref 39.0–52.0)
Hemoglobin: 14.8 g/dL (ref 13.0–17.0)
MCH: 30.3 pg (ref 26.0–34.0)
MCHC: 32.5 g/dL (ref 30.0–36.0)
MCV: 93.2 fL (ref 80.0–100.0)
Platelets: 190 10*3/uL (ref 150–400)
RBC: 4.88 MIL/uL (ref 4.22–5.81)
RDW: 14.3 % (ref 11.5–15.5)
WBC: 7.4 10*3/uL (ref 4.0–10.5)
nRBC: 0 % (ref 0.0–0.2)

## 2018-10-28 LAB — BASIC METABOLIC PANEL
Anion gap: 9 (ref 5–15)
BUN: 10 mg/dL (ref 6–20)
CALCIUM: 9.3 mg/dL (ref 8.9–10.3)
CHLORIDE: 101 mmol/L (ref 98–111)
CO2: 28 mmol/L (ref 22–32)
CREATININE: 0.96 mg/dL (ref 0.61–1.24)
GFR calc Af Amer: >60 mL/min (ref >60)
GFR calc non Af Amer: >60 mL/min (ref >60)
Glucose, Bld: 90 mg/dL (ref 70–99)
Potassium: 3 mmol/L — ABNORMAL LOW (ref 3.5–5.1)
SODIUM: 138 mmol/L (ref 135–145)

## 2018-10-28 LAB — I-STAT TROPONIN, ED: Troponin i, poc: 0 ng/mL (ref 0.00–0.08)

## 2018-10-28 MED ORDER — POTASSIUM CHLORIDE CRYS ER 20 MEQ PO TBCR
40.0000 meq | EXTENDED_RELEASE_TABLET | Freq: Once | ORAL | Status: AC
  Administered 2018-10-29: 40 meq via ORAL
  Filled 2018-10-28: qty 2

## 2018-10-28 NOTE — ED Triage Notes (Signed)
C/o intermittent L chest and L shoulder blade "discomfort" since this morning.  Denies pain at present.  Denies sob, nausea, and vomiting.  Reports sinus pressure x 2 days.

## 2018-10-29 LAB — I-STAT TROPONIN, ED: Troponin i, poc: 0 ng/mL (ref 0.00–0.08)

## 2018-10-29 MED ORDER — POTASSIUM CHLORIDE CRYS ER 20 MEQ PO TBCR: 20.0000 meq | EXTENDED_RELEASE_TABLET | Freq: Two times a day (BID) | ORAL | 0 refills | Status: DC

## 2018-10-29 MED ORDER — OMEPRAZOLE 20 MG PO CPDR: DELAYED_RELEASE_CAPSULE | ORAL | 0 refills | Status: AC

## 2018-10-29 NOTE — Discharge Instructions (Signed)
Your tests here show a low potassium but no evidence of heart attack. Your EKG has some nonspecific abnormalities. For this reason, you have been given a referral to cardiology and it is recommended that you make an appointment for further outpatient evaluation.   Return to the emergency department with any new or worsening symptoms.   Take potassium as directed for the next 3 days. This should be rechecked with your primary care doctor in one week. Take prilosec as prescribed.

## 2018-10-29 NOTE — ED Provider Notes (Signed)
[facility] EMERGENCY DEPARTMENT Provider Note   CSN: [id number] Arrival date & time: 10/28/18  1937     History   Chief Complaint Chief Complaint  Patient presents with  . Chest Pain    HPI Robert Duke is a 47 y.o. male.  Patient with a history of HTN, DM presents for evaluation of left lower chest pain that started after he was up this morning. It has come and go throughout the day without identified triggers. No SOB, nausea, diaphoresis. No history of heart disease. No pain now. He noticed that lying down would worsen the pain. Wife reports he has been taking more frequent doses of antacids for belching and heartburn but denies having this pain before.   The history is provided by the patient and the spouse. No language interpreter was used.  Chest Pain   Pertinent negatives include no abdominal pain, no fever, no nausea, no shortness of breath and no vomiting.    Past Medical History:  Diagnosis Date  . Diabetes mellitus without complication (HCC)   . Hypertension     There are no active problems to display for this patient.   Past Surgical History:  Procedure Laterality Date  . THYROID SURGERY          Home Medications    Prior to Admission medications   Not on File    Family History No family history on file.  Social History Social History   Tobacco Use  . Smoking status: Never Smoker  . Smokeless tobacco: Never Used  Substance Use Topics  . Alcohol use: Never    Frequency: Never  . Drug use: Never     Allergies   Patient has no allergy information on record.   Review of Systems Review of Systems  Constitutional: Negative for chills and fever.  HENT: Negative.   Respiratory: Negative.  Negative for shortness of breath.   Cardiovascular: Positive for chest pain.  Gastrointestinal: Negative.  Negative for abdominal pain, nausea and vomiting.  Musculoskeletal: Negative.   Skin: Negative.   Neurological: Negative.       Physical Exam Updated Vital Signs BP 122/80   Pulse 75   Temp 98.7 F (37.1 C) (Oral)   Resp (!) 23   SpO2 97%   Physical Exam  Constitutional: He is oriented to person, place, and time. He appears well-developed and well-nourished.  Morbidly obese patient in NAD.  HENT:  Head: Normocephalic.  Neck: Normal range of motion. Neck supple.  Cardiovascular: Normal rate and regular rhythm.  Pulmonary/Chest: Effort normal and breath sounds normal. He has no wheezes. He has no rhonchi. He has no rales.  Abdominal: Soft. Bowel sounds are normal. There is no tenderness. There is no rebound and no guarding.  Musculoskeletal: Normal range of motion.       Right lower leg: He exhibits edema.       Left lower leg: He exhibits edema.  Minimal bilateral LE edema.  Neurological: He is alert and oriented to person, place, and time.  Skin: Skin is warm and dry. No rash noted.  Psychiatric: He has a normal mood and affect.  Nursing note and vitals reviewed.    ED Treatments / Results  Labs (all labs ordered are listed, but only abnormal results are displayed) Labs Reviewed  BASIC METABOLIC PANEL - Abnormal; Notable for the following components:      Result Value   Potassium 3.0 (*)    All other components within normal limits  CBC  I-STAT TROPONIN, ED  I-STAT TROPONIN, ED    EKG EKG Interpretation  Date/Time:  Sunday October 28 2018 19:42:11 EST Ventricular Rate:  91 PR Interval:  172 QRS Duration: 84 QT Interval:  356 QTC Calculation: 437 R Axis:   39 Text Interpretation:  Sinus rhythm with occasional Premature ventricular complexes Nonspecific ST and T wave abnormality Abnormal ECG No old tracing to compare Confirmed by Dione Booze ([id number]) on 10/28/2018 11:56:05 PM   Radiology Dg Chest 2 View  Result Date: 10/28/2018 CLINICAL DATA:  Intermittent left chest and shoulder pain/discomfort since this morning. Sinus pressure for 2 days. EXAM: CHEST - 2 VIEW COMPARISON:   10/23/2018 FINDINGS: The cardiomediastinal silhouette is unchanged and within normal limits. Mild elevation of the left hemidiaphragm is unchanged. No airspace consolidation, edema, pleural effusion, or pneumothorax is identified. 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 No acute osseous abnormality is seen. IMPRESSION: No active cardiopulmonary disease. Electronically Signed   By: Sebastian Ache M.D.   On: 10/28/2018 20:46    Procedures Procedures (including critical care time)  Medications Ordered in ED Medications  potassium chloride SA (K-DUR,KLOR-CON) CR tablet 40 mEq (40 mEq Oral Given 10/29/18 0006)     Initial Impression / Assessment and Plan / ED Course  I have reviewed the triage vital signs and the nursing notes.  Pertinent labs & imaging results that were available during my care of the patient were reviewed by me and considered in my medical decision making (see chart for details).     Patient presents with chest discomfort in the left lower chest off and on throughout today. No inciting factors. Lying down worsens symptoms. Increased belching. No heart history but has risk factors of HTN, obesity, DM. Currently asymptomatic.  EKG shows no acute changes. Labs are essentially negative, including troponin x 1, with mild hypokalemia only. PO potassium supplement ordered.   Will check delta troponin. Doubt ACS as pain is atypical and abnormal EKG without acute ischemia, labs. Suspect GI source of symptoms given pattern of pain and increased antacid use recently.   Anticipate discharge home. Will trial BID Prilosec with PCP follow up in one week for recheck.   Delta troponin negative. Patient remains asymptomatic. Can discharge home with close PCP follow up. Given risk factors and abnormal EKG, will refer to cardiology as well.   Final Clinical Impressions(s) / ED Diagnoses   Final diagnoses:  None   1. Nonspecific chest pain  ED Discharge Orders    None         Elpidio Anis, PA-C 10/29/18 0119    Ward, Layla Maw, DO 10/29/18 0147

## 2018-10-30 ENCOUNTER — Encounter: Payer: Self-pay | Admitting: Cardiology

## 2018-10-30 ENCOUNTER — Other Ambulatory Visit: Payer: Self-pay

## 2018-10-30 ENCOUNTER — Ambulatory Visit (INDEPENDENT_AMBULATORY_CARE_PROVIDER_SITE_OTHER): Payer: Managed Care, Other (non HMO) | Admitting: Cardiology

## 2018-10-30 VITALS — BP 118/83 | HR 83 | Ht 74.0 in | Wt >= 6400 oz

## 2018-10-30 DIAGNOSIS — E119 Type 2 diabetes mellitus without complications: Secondary | ICD-10-CM

## 2018-10-30 DIAGNOSIS — R0789 Other chest pain: Secondary | ICD-10-CM | POA: Diagnosis not present

## 2018-10-30 DIAGNOSIS — I10 Essential (primary) hypertension: Secondary | ICD-10-CM

## 2018-10-30 MED ORDER — CHLORTHALIDONE 25 MG PO TABS: 25.0000 mg | ORAL_TABLET | Freq: Every day | ORAL | 3 refills | Status: AC

## 2018-10-30 MED ORDER — POTASSIUM CHLORIDE CRYS ER 20 MEQ PO TBCR: 20.0000 meq | EXTENDED_RELEASE_TABLET | Freq: Every day | ORAL | 11 refills | Status: DC

## 2018-10-30 MED ORDER — POTASSIUM CHLORIDE CRYS ER 20 MEQ PO TBCR: 20.0000 meq | EXTENDED_RELEASE_TABLET | Freq: Every day | ORAL | 11 refills | Status: AC

## 2018-10-30 NOTE — Progress Notes (Signed)
Cardiology Office Note   Date:  10/30/2018   ID:  Robert Duke, DOB [birth year], MRN [id number]  PCP:  Marva Panda, NP  Cardiologist:   Peter Swaziland, MD   Chief Complaint  Patient presents with  . Chest Pain      History of Present Illness: Robert Duke is a 47 y.o. male who is seen at the request of Baxter Hire Ward DO for evaluation of chest pain. He has a history of DM type 2, HTN, and OSA. He was recently evaluated in the ED on 10/28/18 for chest pain. Ecg showed nonspecific TWA. Delta troponin was normal. Due to risk factors cardiac evaluation requested.   The patient reports he complained of a slight pain in the left breast region. Felt like there was something there. No radiation. No SOB, nausea, palpitation, or diaphoresis. Did notes increased gas and bowel movement. Had been taking antacids more frequently for the week prior. Started on Prilosec and since then no complaints.   He does not wear CPAP now. Apparently evaluated at [facility] before. States he just quit using. He is currently unemployed. 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     Past Medical History:  Diagnosis Date  . Arrhythmia    HX OF IRREGULAR HEART RATE RELATED TO ELEVATED CALCIUM  . Chronic lower back pain   . Diabetes mellitus without complication (HCC)   . Hyperlipidemia   . Hyperparathyroidism (HCC)   . Hypertension   . Parathyroid disease (HCC)   . Sleep apnea    CPAP  . Sleep apnea     Past Surgical History:  Procedure Laterality Date  . PARATHYROIDECTOMY    . THYROID SURGERY       Current Outpatient Medications  Medication Sig Dispense Refill  . atorvastatin (LIPITOR) 40 MG tablet Take 40 mg by mouth daily.  2  . cyanocobalamin ([employer] VITAMIN B12) 1000 MCG tablet Take by mouth daily.    . metFORMIN (GLUCOPHAGE-XR) 500 MG 24 hr tablet Take 500 mg by mouth daily.  2  . Multiple Vitamin (MULTI-VITAMINS) TABS Take by mouth.    Marland Kitchen omeprazole (PRILOSEC) 20 MG capsule Take  one tablet twice daily for the next 5 days, then once daily after that 30 capsule 0  . prednisoLONE acetate (PRED FORTE) 1 % ophthalmic suspension SHAKE LQ AND INT 1 GTT IN OS QID  0  . chlorthalidone (HYGROTON) 25 MG tablet Take 1 tablet (25 mg total) by mouth daily. 90 tablet 3  . potassium chloride SA (K-DUR,KLOR-CON) 20 MEQ tablet Take 1 tablet (20 mEq total) by mouth daily. 30 tablet 11   No current facility-administered medications for this visit.     Allergies:   Patient has no known allergies.    Social History:  The patient  reports that he has never smoked. He has never used smokeless tobacco. He reports that he does not drink alcohol or use drugs.   Family History:  The patient's family history includes Cancer in his mother; Diabetes in his father.    ROS:  Please see the history of present illness.   Otherwise, review of systems are positive for none.   All other systems are reviewed and negative.    PHYSICAL EXAM: VS:  BP 118/83   Pulse 83   Ht 6\' 2"  (1.88 m)   Wt (!) 414 lb 12.8 oz (188.2 kg)   BMI 53.26 kg/m  , BMI Body mass index is 53.26 kg/m. GEN: Well nourished, super  morbidly obese BM, in no acute distress  HEENT: normal  Neck: no JVD, carotid bruits, or masses Cardiac: RRR; no murmurs, rubs, or gallops,no edema  Respiratory:  clear to auscultation bilaterally, normal work of breathing GI: soft, nontender, nondistended, + BS MS: no deformity or atrophy  Skin: warm and dry, no rash Neuro:  Strength and sensation are intact Psych: euthymic mood, full affect   EKG:  EKG is not ordered today. The ekg ordered 10/28/18 demonstrates NSR with PVCs. Nonspecific ST T wave abnormality. I have personally reviewed and interpreted this study.    Recent Labs: 10/28/2018: BUN 10; Creatinine, Ser 0.96; Hemoglobin 14.8; Platelets 190; Potassium 3.0; Sodium 138    Lipid Panel No results found for: CHOL, TRIG, HDL, CHOLHDL, VLDL, LDLCALC, LDLDIRECT    Wt Readings  from Last 3 Encounters:  10/30/18 (!) 414 lb 12.8 oz (188.2 kg)      Other studies Reviewed: Additional studies/ records that were reviewed today include: none   ASSESSMENT AND PLAN:  1.  Atypical chest pain. Symptoms most likely GI related by history and improved with Prilosec. While he does have several cardiac risk factors his symptoms are not really concerning for cardiac cause. At this point would focus on continued risk factor modification. If symptoms should recur or worsen we could reconsider. Cardiac evaluation would be quite limited given his weight 2. DM on metformin per primary care 3. HTN controlled on chlorthalidone. Needs to be on potassium supplement with this given recently low potassium. I ordered this. 4. Super morbid obesity with OSA. I asked him to bring me the results of his sleep study. If recent enough we can get him established with Dr. Tresa Endo to treat his sleep apnea. Needs to focus on weight loss and increased aerobic activity.  5. HLD on statin.    Current medicines are reviewed at length with the patient today.  The patient does not have concerns regarding medicines.  The following changes have been made:  Add potassium supplement.  Labs/ tests ordered today include: none No orders of the defined types were placed in this encounter.    Disposition:   FU with me PRN  Signed, Peter Swaziland, MD  10/30/2018 11:57 AM    [facility] [address], [place], [place], [address] Phone [phone], Fax [phone]

## 2018-10-30 NOTE — Patient Instructions (Addendum)
Continue potassium daily  Continue your other medication.  If you can get me the results of your prior sleep study we can help get you established with CPAP therapy  Focus on increasing your aerobic activity and weight loss with low carbohydrate diet.

## 2019-02-04 ENCOUNTER — Ambulatory Visit (HOSPITAL_BASED_OUTPATIENT_CLINIC_OR_DEPARTMENT_OTHER): Payer: Managed Care, Other (non HMO) | Attending: *Deleted | Admitting: Internal Medicine

## 2019-02-04 ENCOUNTER — Other Ambulatory Visit: Payer: Self-pay | Admitting: *Deleted

## 2019-02-04 ENCOUNTER — Other Ambulatory Visit (HOSPITAL_BASED_OUTPATIENT_CLINIC_OR_DEPARTMENT_OTHER): Payer: Self-pay

## 2019-02-04 DIAGNOSIS — G4733 Obstructive sleep apnea (adult) (pediatric): Secondary | ICD-10-CM | POA: Diagnosis not present

## 2019-02-04 DIAGNOSIS — R42 Dizziness and giddiness: Secondary | ICD-10-CM

## 2019-02-16 DIAGNOSIS — G4733 Obstructive sleep apnea (adult) (pediatric): Secondary | ICD-10-CM

## 2019-02-16 NOTE — Procedures (Signed)
  Patient Name: Robert Duke, Robert Duke Date: 02/04/2019 Gender: Male D.O.B: Aug 05, 1971 Age (years): 47 Referring Provider: Marva Panda NP Height (inches): 74 Interpreting Physician: Jetty Duhamel MD, ABSM Weight (lbs): 400 RPSGT: [place] BMI: 51 MRN: [id number] Neck Size: 21.00  CLINICAL INFORMATION Sleep Study Type: HST Indication for sleep study: OSA Epworth Sleepiness Score: 11  SLEEP STUDY TECHNIQUE A multi-channel overnight portable sleep study was performed. The channels recorded were: nasal airflow, thoracic respiratory movement, and oxygen saturation with a pulse oximetry. Snoring was also monitored.  MEDICATIONS Patient self administered medications include: none reported.  SLEEP ARCHITECTURE Patient was studied for 441.8 minutes. The sleep efficiency was 100.0 % and the patient was supine for 90.1%. The arousal index was 0.0 per hour.  RESPIRATORY PARAMETERS The overall AHI was 23.8 per hour, with a central apnea index of 0.0 per hour. The oxygen nadir was 83% during sleep.  CARDIAC DATA Mean heart rate during sleep was 77.7 bpm.  IMPRESSIONS - Moderate obstructive sleep apnea occurred during this study (AHI = 23.8/h). - No significant central sleep apnea occurred during this study (CAI = 0.0/h). - Oxygen desaturation was noted during this study (Min O2 = 83%). Mean sat 93%. - Patient snored.  DIAGNOSIS - Obstructive Sleep Apnea (327.23 [G47.33 ICD-10])  RECOMMENDATIONS - Suggest CPAP titration sleep study or autopap. Other options may include Sleep medical consultation, a fitted oral appliance, or ENT evaluation, based on clinical judgment. - Be careful with alcohol, sedatives and other CNS depressants that may worsen sleep apnea and disrupt normal sleep architecture. - Sleep hygiene should be reviewed to assess factors that may improve sleep quality. - Weight management and regular exercise should be initiated or continued.  [Electronically  signed] 02/16/2019 11:13 AM  Jetty Duhamel MD, ABSM Diplomate, American Board of Sleep Medicine   NPI: [id number]                           Jetty Duhamel Diplomate, American Board of Sleep Medicine  ELECTRONICALLY SIGNED ON:  02/16/2019, 11:11 AM [facility] PH: [phone]   FX: [phone] ACCREDITED BY THE AMERICAN ACADEMY OF SLEEP MEDICINE

## 2019-07-07 IMAGING — CR DG CHEST 2V
3 series · 3 of 3 positions shown · non-contrast
Comparison: None.

CLINICAL DATA: Uveitis.

EXAM:
CHEST - 2 VIEW

[w chest pa]
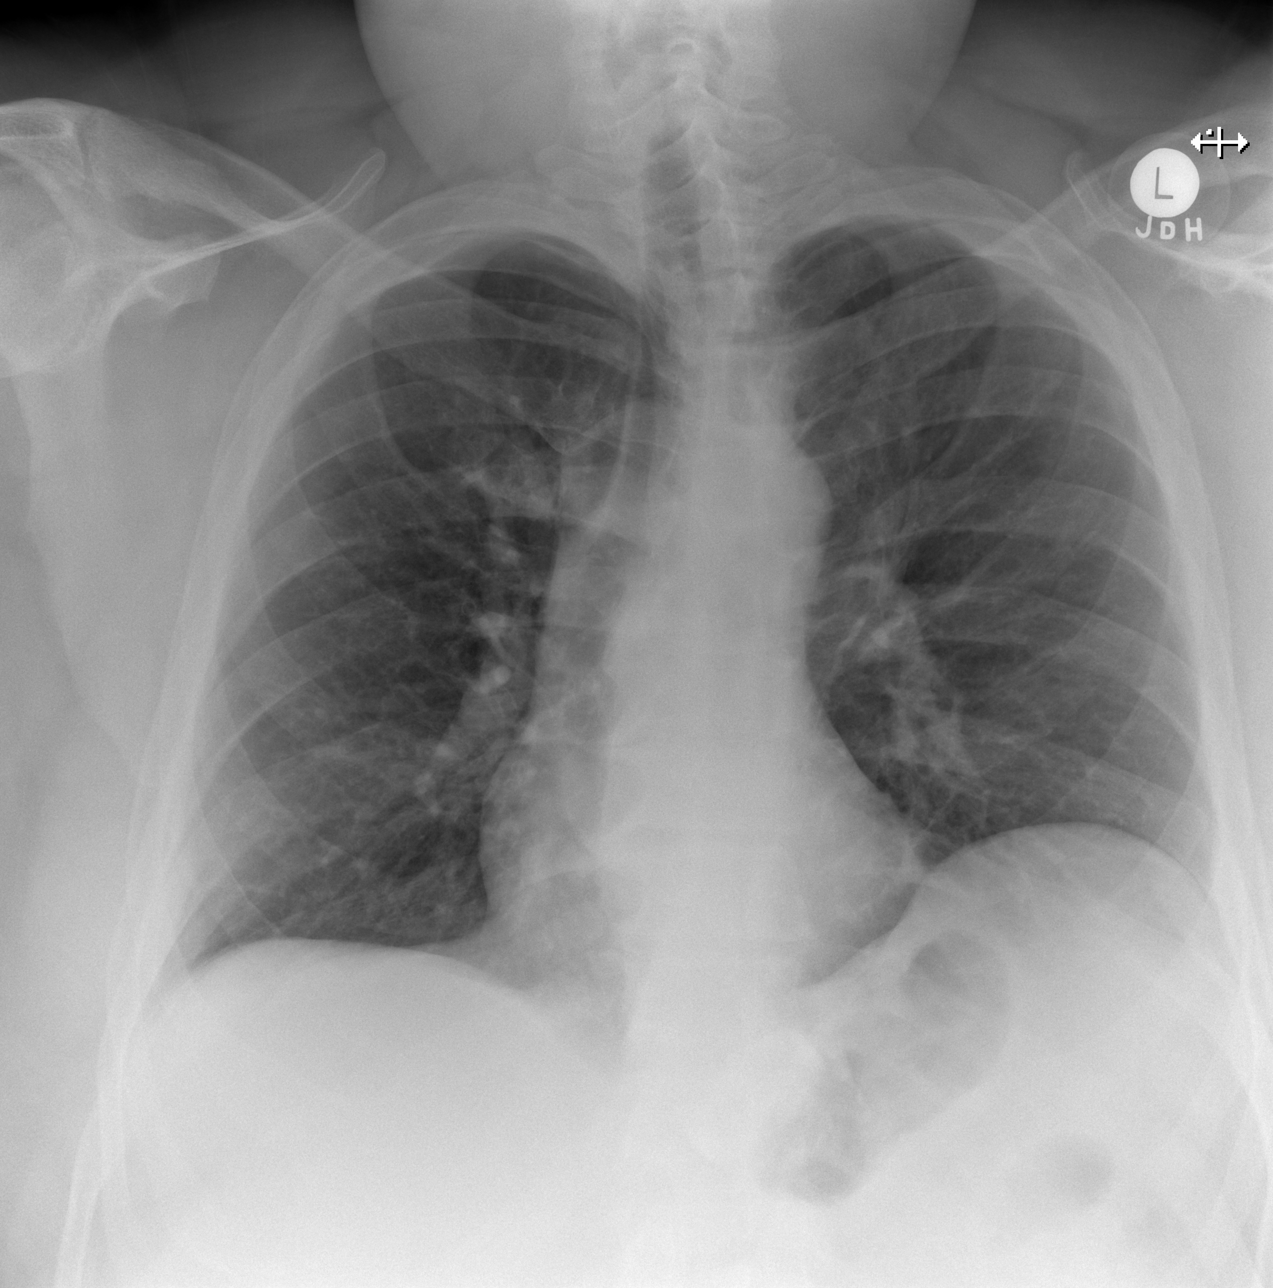

[w chest lat (1 of 2)]
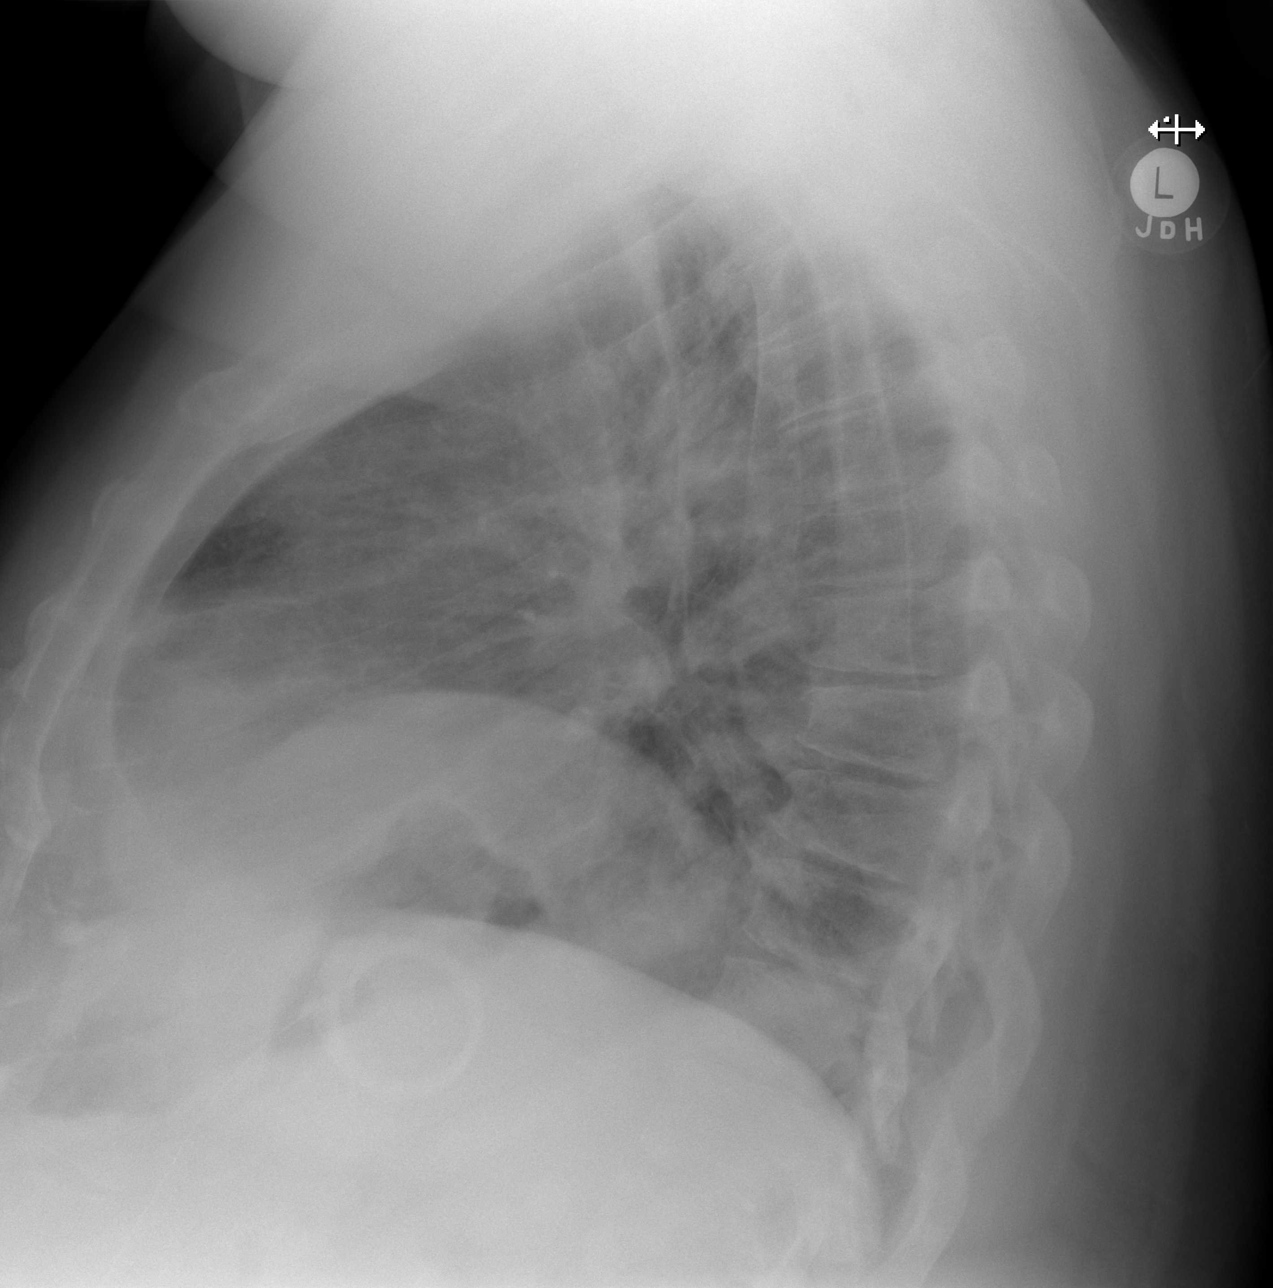

[w chest lat (2 of 2)]
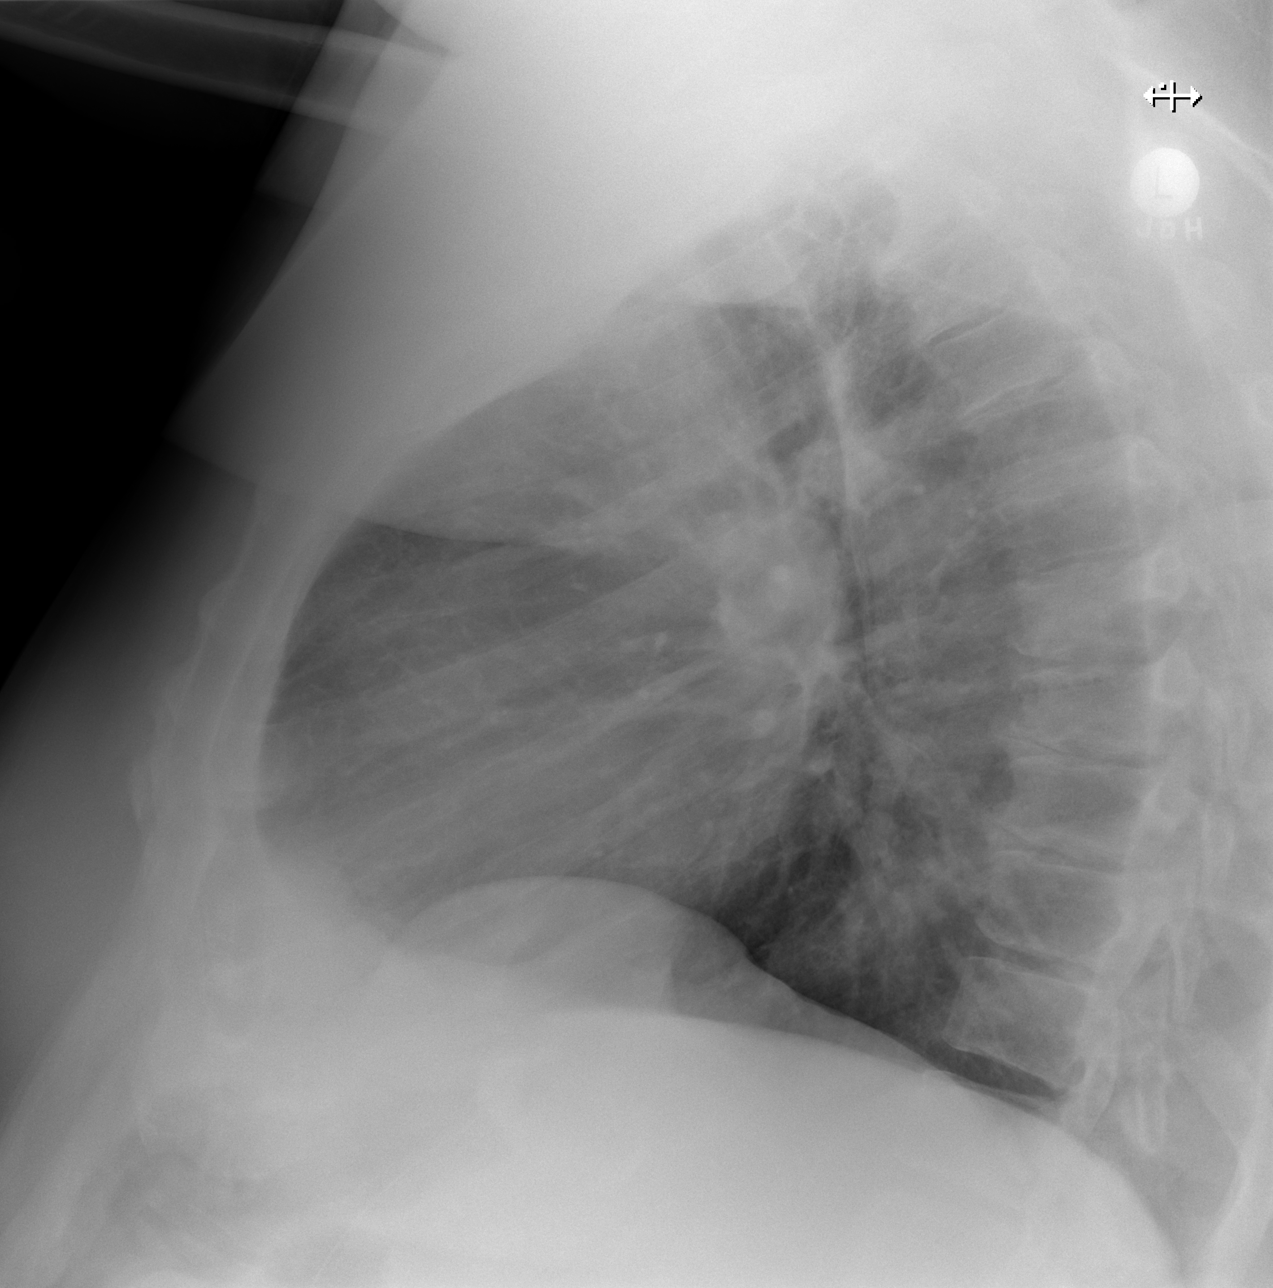

[3 of 3 positions shown; findings below may reference images not displayed]

FINDINGS: The heart size and mediastinal contours are within normal limits.
There is elevation of the left hemidiaphragm. There appears to be a
radiopaque laparoscopic gastric band. There is no evidence of
pulmonary edema, consolidation, pneumothorax, nodule or pleural
fluid. The thoracic spine shows mild degenerative disease.
IMPRESSION: No active cardiopulmonary disease.

## 2020-03-13 ENCOUNTER — Ambulatory Visit: Payer: Managed Care, Other (non HMO) | Attending: Internal Medicine

## 2020-03-13 DIAGNOSIS — Z23 Encounter for immunization: Secondary | ICD-10-CM

## 2020-03-13 NOTE — Progress Notes (Signed)
   Covid-19 Vaccination Clinic  Name:  Robert Duke    MRN: [id number] DOB: [birth year]  03/13/2020  [name] was observed post Covid-19 immunization for 15 minutes without incident. He was provided with Vaccine Information Sheet and instruction to access the V-Safe system.   Mr. Corpus was instructed to call 911 with any severe reactions post vaccine: Marland Kitchen Difficulty breathing  . Swelling of face and throat  . A fast heartbeat  . A bad rash all over body  . Dizziness and weakness   Immunizations Administered    Name Date Dose VIS Date Route   Pfizer COVID-19 Vaccine 03/13/2020  2:24 PM 0.3 mL 12/06/2019 Intramuscular   Manufacturer: [employer], [employer]   Lot: [id number]   NDC: [address]-2

## 2020-04-07 ENCOUNTER — Ambulatory Visit: Payer: Managed Care, Other (non HMO) | Attending: Internal Medicine

## 2020-04-07 DIAGNOSIS — Z23 Encounter for immunization: Secondary | ICD-10-CM

## 2020-04-07 NOTE — Progress Notes (Signed)
   Covid-19 Vaccination Clinic  Name:  Robert Duke    MRN: [id number] DOB: 17-Dec-1971  04/07/2020  [name] was observed post Covid-19 immunization for 15 minutes without incident. He was provided with Vaccine Information Sheet and instruction to access the V-Safe system.   [name] was instructed to call 911 with any severe reactions post vaccine: Marland Kitchen Difficulty breathing  . Swelling of face and throat  . A fast heartbeat  . A bad rash all over body  . Dizziness and weakness   Immunizations Administered    Name Date Dose VIS Date Route   Pfizer COVID-19 Vaccine 04/07/2020  3:30 PM 0.3 mL 12/06/2019 Intramuscular   Manufacturer: [employer], [employer]   Lot: [id number]   NDC: [address]-0
